# Patient Record
Sex: Male | Born: 1960 | Race: White | Marital: Married | Smoking: Former smoker
Health system: Southern US, Community
[De-identification: ages and names within clinical notes are randomized; demographics above are authoritative.]

## PROBLEM LIST (undated history)

## (undated) DIAGNOSIS — T7840XA Allergy, unspecified, initial encounter: Secondary | ICD-10-CM

## (undated) DIAGNOSIS — M1990 Unspecified osteoarthritis, unspecified site: Secondary | ICD-10-CM

## (undated) DIAGNOSIS — E785 Hyperlipidemia, unspecified: Secondary | ICD-10-CM

## (undated) HISTORY — DX: Unspecified osteoarthritis, unspecified site: M19.90

## (undated) HISTORY — DX: Allergy, unspecified, initial encounter: T78.40XA

## (undated) HISTORY — DX: Hyperlipidemia, unspecified: E78.5

## (undated) HISTORY — PX: WISDOM TOOTH EXTRACTION: SHX21

## (undated) HISTORY — PX: MOUTH SURGERY: SHX715

---

## 2012-05-20 ENCOUNTER — Encounter: Payer: Self-pay | Admitting: Internal Medicine

## 2012-07-15 ENCOUNTER — Ambulatory Visit (AMBULATORY_SURGERY_CENTER): Payer: 59 | Admitting: *Deleted

## 2012-07-15 ENCOUNTER — Encounter: Payer: Self-pay | Admitting: Internal Medicine

## 2012-07-15 VITALS — Ht 75.0 in | Wt 264.2 lb

## 2012-07-15 DIAGNOSIS — Z1211 Encounter for screening for malignant neoplasm of colon: Secondary | ICD-10-CM

## 2012-07-15 MED ORDER — MOVIPREP 100 G PO SOLR: ORAL | Status: DC

## 2012-07-29 ENCOUNTER — Ambulatory Visit (AMBULATORY_SURGERY_CENTER): Payer: 59 | Admitting: Internal Medicine

## 2012-07-29 ENCOUNTER — Encounter: Payer: Self-pay | Admitting: Internal Medicine

## 2012-07-29 VITALS — BP 112/75 | HR 66 | Temp 96.7°F | Resp 20 | Ht 75.0 in | Wt 264.0 lb

## 2012-07-29 DIAGNOSIS — D126 Benign neoplasm of colon, unspecified: Secondary | ICD-10-CM

## 2012-07-29 DIAGNOSIS — Z1211 Encounter for screening for malignant neoplasm of colon: Secondary | ICD-10-CM

## 2012-07-29 MED ORDER — SODIUM CHLORIDE 0.9 % IV SOLN: 500.0000 mL | INTRAVENOUS | Status: DC

## 2012-07-29 NOTE — Op Note (Signed)
[facility] [address] [place], [address]   COLONOSCOPY PROCEDURE REPORT  PATIENT: [name], [name]  MR#: [id number] BIRTHDATE: May 29, 1960 , 52  yrs. old GENDER: Male ENDOSCOPIST: Roxy Cedar, MD REFERRED QM:VHQI Perini, M.D. PROCEDURE DATE:  07/29/2012 PROCEDURE:   Colonoscopy with snare polypectomy x 2 First Screening Colonoscopy - Avg.  risk and is 50 yrs.  old or older Yes.  Prior Negative Screening - Now for repeat screening. N/A  History of Adenoma - Now for follow-up colonoscopy & has been > or = to 3 yrs.  N/A  Polyps Removed Today? Yes. ASA CLASS:   Class II INDICATIONS:average risk screening. MEDICATIONS: MAC sedation, administered by CRNA and propofol (Diprivan) 400mg  IV  DESCRIPTION OF PROCEDURE:   After the risks benefits and alternatives of the procedure were thoroughly explained, informed consent was obtained.  A digital rectal exam revealed no abnormalities of the rectum.   The [id number]  endoscope was introduced through the anus and advanced to the cecum, which was identified by both the appendix and ileocecal valve. No adverse events experienced.   The quality of the prep was excellent, using MoviPrep  The instrument was then slowly withdrawn as the colon was fully examined.    COLON FINDINGS: Two diminutive pedunculated polyps were found in the transverse colon and descending colon.  A polypectomy was performed with a cold snare.  The resection was complete and the polyp tissue was completely retrieved.   Mild diverticulosis was noted in the sigmoid colon.   The colon mucosa was otherwise normal. Retroflexed views revealed internal hemorrhoids. The time to cecum=2 minutes 03 seconds.  Withdrawal time=12 minutes 15 seconds. The scope was withdrawn and the procedure completed.  COMPLICATIONS: There were no complications.  ENDOSCOPIC IMPRESSION: 1.   Two diminutive pedunculated polyps were found in the colon and removed  with a cold snare 2.   Mild diverticulosis was noted in the sigmoid colon 3.   The colon mucosa was otherwise normal  RECOMMENDATIONS: 1. Repeat colonoscopy in 5 years if polyp adenomatous; otherwise 10 years   eSigned:  Roxy Cedar, MD 07/29/2012 9:56 AM   cc: Rodrigo Ran, MD and The Patient   PATIENT NAME:  Michael Daniel, Michael Daniel MR#: [id number]

## 2012-07-29 NOTE — Patient Instructions (Addendum)

## 2012-07-29 NOTE — Progress Notes (Signed)
Called to room to assist during endoscopic procedure.  Patient ID and intended procedure confirmed with present staff. Received instructions for my participation in the procedure from the performing physician.  

## 2012-07-29 NOTE — Progress Notes (Signed)
Patient did not experience any of the following events: a burn prior to discharge; a fall within the facility; wrong site/side/patient/procedure/implant event; or a hospital transfer or hospital admission upon discharge from the facility. (G8907) Patient did not have preoperative order for IV antibiotic SSI prophylaxis. ([id number])  

## 2012-07-30 ENCOUNTER — Telehealth: Payer: Self-pay | Admitting: *Deleted

## 2012-07-30 NOTE — Telephone Encounter (Signed)
  Follow up Call-  Call back number 07/29/2012  Post procedure Call Back phone  # [phone]  Permission to leave phone message Yes     Patient questions:  Do you have a fever, pain , or abdominal swelling? no Pain Score  0 *  Have you tolerated food without any problems? yes  Have you been able to return to your normal activities? yes  Do you have any questions about your discharge instructions: Diet   no Medications  no Follow up visit  no  Do you have questions or concerns about your Care? no  Actions: * If pain score is 4 or above: No action needed, pain <4.

## 2012-08-05 ENCOUNTER — Encounter: Payer: Self-pay | Admitting: Internal Medicine

## 2015-02-19 ENCOUNTER — Ambulatory Visit
Admission: RE | Admit: 2015-02-19 | Discharge: 2015-02-19 | Disposition: A | Discharge status: Home / Self Care | Payer: 59 | Source: Ambulatory Visit | Attending: Internal Medicine | Admitting: Internal Medicine

## 2015-02-19 ENCOUNTER — Other Ambulatory Visit: Payer: Self-pay | Admitting: Internal Medicine

## 2015-02-19 DIAGNOSIS — R140 Abdominal distension (gaseous): Secondary | ICD-10-CM

## 2015-02-19 MED ORDER — IOPAMIDOL (ISOVUE-300) INJECTION 61%
125.0000 mL | Freq: Once | INTRAVENOUS | Status: AC | PRN
  Administered 2015-02-19: 125 mL via INTRAVENOUS

## 2016-07-05 DIAGNOSIS — Z125 Encounter for screening for malignant neoplasm of prostate: Secondary | ICD-10-CM | POA: Diagnosis not present

## 2016-07-05 DIAGNOSIS — E784 Other hyperlipidemia: Secondary | ICD-10-CM | POA: Diagnosis not present

## 2016-07-12 DIAGNOSIS — D126 Benign neoplasm of colon, unspecified: Secondary | ICD-10-CM | POA: Diagnosis not present

## 2016-07-12 DIAGNOSIS — J3089 Other allergic rhinitis: Secondary | ICD-10-CM | POA: Diagnosis not present

## 2016-07-12 DIAGNOSIS — Z0000 Encounter for general adult medical examination without abnormal findings: Secondary | ICD-10-CM | POA: Diagnosis not present

## 2016-07-12 DIAGNOSIS — M545 Low back pain: Secondary | ICD-10-CM | POA: Diagnosis not present

## 2016-07-12 DIAGNOSIS — L570 Actinic keratosis: Secondary | ICD-10-CM | POA: Diagnosis not present

## 2016-07-12 DIAGNOSIS — Z1389 Encounter for screening for other disorder: Secondary | ICD-10-CM | POA: Diagnosis not present

## 2017-04-02 DIAGNOSIS — H40013 Open angle with borderline findings, low risk, bilateral: Secondary | ICD-10-CM | POA: Diagnosis not present

## 2017-04-02 DIAGNOSIS — H40053 Ocular hypertension, bilateral: Secondary | ICD-10-CM | POA: Diagnosis not present

## 2017-06-06 ENCOUNTER — Encounter: Payer: Self-pay | Admitting: Internal Medicine

## 2017-06-19 ENCOUNTER — Encounter: Payer: Self-pay | Admitting: Internal Medicine

## 2017-08-24 ENCOUNTER — Ambulatory Visit (AMBULATORY_SURGERY_CENTER): Payer: Self-pay

## 2017-08-24 VITALS — Ht 75.0 in | Wt 282.0 lb

## 2017-08-24 DIAGNOSIS — Z86010 Personal history of colonic polyps: Secondary | ICD-10-CM

## 2017-08-24 DIAGNOSIS — Z0000 Encounter for general adult medical examination without abnormal findings: Secondary | ICD-10-CM | POA: Diagnosis not present

## 2017-08-24 DIAGNOSIS — Z125 Encounter for screening for malignant neoplasm of prostate: Secondary | ICD-10-CM | POA: Diagnosis not present

## 2017-08-24 DIAGNOSIS — E7849 Other hyperlipidemia: Secondary | ICD-10-CM | POA: Diagnosis not present

## 2017-08-24 MED ORDER — NA SULFATE-K SULFATE-MG SULF 17.5-3.13-1.6 GM/177ML PO SOLN: 1.0000 | Freq: Once | ORAL | 0 refills | Status: AC

## 2017-08-24 NOTE — Progress Notes (Signed)
Denies allergies to eggs or soy products. Denies complication of anesthesia or sedation. Denies use of weight loss medication. Denies use of O2.   Emmi instructions declined.  

## 2017-08-30 ENCOUNTER — Encounter: Payer: Self-pay | Admitting: Internal Medicine

## 2017-08-30 DIAGNOSIS — M545 Low back pain: Secondary | ICD-10-CM | POA: Diagnosis not present

## 2017-08-30 DIAGNOSIS — D126 Benign neoplasm of colon, unspecified: Secondary | ICD-10-CM | POA: Diagnosis not present

## 2017-08-30 DIAGNOSIS — Z0000 Encounter for general adult medical examination without abnormal findings: Secondary | ICD-10-CM | POA: Diagnosis not present

## 2017-08-30 DIAGNOSIS — L570 Actinic keratosis: Secondary | ICD-10-CM | POA: Diagnosis not present

## 2017-08-30 DIAGNOSIS — N528 Other male erectile dysfunction: Secondary | ICD-10-CM | POA: Diagnosis not present

## 2017-08-30 DIAGNOSIS — Z1389 Encounter for screening for other disorder: Secondary | ICD-10-CM | POA: Diagnosis not present

## 2017-09-05 ENCOUNTER — Telehealth: Payer: Self-pay | Admitting: Internal Medicine

## 2017-09-05 NOTE — Telephone Encounter (Signed)
Okay 

## 2017-09-05 NOTE — Telephone Encounter (Signed)
Has been trying to get a straight answer from his insurance about how they will be paying on his upcoming procedure since he received our Precert letter back on [phone]. They have finally today confirmed that he will need to meet his deductible completely before they cover at 80%. Unable to afford this at this time. Will call back later to reschedule.

## 2017-09-07 ENCOUNTER — Encounter: Payer: 59 | Admitting: Internal Medicine

## 2017-09-13 DIAGNOSIS — N433 Hydrocele, unspecified: Secondary | ICD-10-CM | POA: Diagnosis not present

## 2018-07-29 DIAGNOSIS — Z03818 Encounter for observation for suspected exposure to other biological agents ruled out: Secondary | ICD-10-CM | POA: Diagnosis not present

## 2018-10-02 DIAGNOSIS — Z125 Encounter for screening for malignant neoplasm of prostate: Secondary | ICD-10-CM | POA: Diagnosis not present

## 2018-10-02 DIAGNOSIS — E7849 Other hyperlipidemia: Secondary | ICD-10-CM | POA: Diagnosis not present

## 2018-10-02 DIAGNOSIS — Z23 Encounter for immunization: Secondary | ICD-10-CM | POA: Diagnosis not present

## 2018-10-02 DIAGNOSIS — Z0000 Encounter for general adult medical examination without abnormal findings: Secondary | ICD-10-CM | POA: Diagnosis not present

## 2018-10-09 DIAGNOSIS — M545 Low back pain: Secondary | ICD-10-CM | POA: Diagnosis not present

## 2018-10-09 DIAGNOSIS — N529 Male erectile dysfunction, unspecified: Secondary | ICD-10-CM | POA: Diagnosis not present

## 2018-10-09 DIAGNOSIS — Z0000 Encounter for general adult medical examination without abnormal findings: Secondary | ICD-10-CM | POA: Diagnosis not present

## 2018-10-09 DIAGNOSIS — Z1331 Encounter for screening for depression: Secondary | ICD-10-CM | POA: Diagnosis not present

## 2018-10-09 DIAGNOSIS — W108XXS Fall (on) (from) other stairs and steps, sequela: Secondary | ICD-10-CM | POA: Diagnosis not present

## 2018-10-09 DIAGNOSIS — N433 Hydrocele, unspecified: Secondary | ICD-10-CM | POA: Diagnosis not present

## 2018-10-15 ENCOUNTER — Other Ambulatory Visit: Payer: Self-pay | Admitting: Internal Medicine

## 2018-10-15 DIAGNOSIS — E785 Hyperlipidemia, unspecified: Secondary | ICD-10-CM

## 2018-10-16 DIAGNOSIS — Z1211 Encounter for screening for malignant neoplasm of colon: Secondary | ICD-10-CM | POA: Diagnosis not present

## 2018-10-29 ENCOUNTER — Other Ambulatory Visit: Payer: BLUE CROSS/BLUE SHIELD

## 2018-11-13 ENCOUNTER — Ambulatory Visit
Admission: RE | Admit: 2018-11-13 | Discharge: 2018-11-13 | Disposition: A | Discharge status: Home / Self Care | Payer: BC Managed Care – PPO | Source: Ambulatory Visit | Attending: Internal Medicine | Admitting: Internal Medicine

## 2018-11-13 DIAGNOSIS — E785 Hyperlipidemia, unspecified: Secondary | ICD-10-CM

## 2018-11-21 ENCOUNTER — Ambulatory Visit (INDEPENDENT_AMBULATORY_CARE_PROVIDER_SITE_OTHER): Payer: BC Managed Care – PPO | Admitting: Cardiology

## 2018-11-21 ENCOUNTER — Other Ambulatory Visit: Payer: Self-pay

## 2018-11-21 ENCOUNTER — Encounter: Payer: Self-pay | Admitting: Cardiology

## 2018-11-21 VITALS — BP 120/77 | HR 72 | Ht 75.0 in | Wt 279.0 lb

## 2018-11-21 DIAGNOSIS — R0609 Other forms of dyspnea: Secondary | ICD-10-CM

## 2018-11-21 DIAGNOSIS — R0600 Dyspnea, unspecified: Secondary | ICD-10-CM | POA: Diagnosis not present

## 2018-11-21 DIAGNOSIS — Z720 Tobacco use: Secondary | ICD-10-CM | POA: Insufficient documentation

## 2018-11-21 DIAGNOSIS — R931 Abnormal findings on diagnostic imaging of heart and coronary circulation: Secondary | ICD-10-CM | POA: Diagnosis not present

## 2018-11-21 MED ORDER — ATORVASTATIN CALCIUM 80 MG PO TABS: 40.0000 mg | ORAL_TABLET | Freq: Every day | ORAL | 3 refills | Status: DC

## 2018-11-21 NOTE — Patient Instructions (Signed)
Physical activity recommendation (The Physical Activity Guidelines for Americans. JAMA 2018;Nov 12) At least 150-300 minutes a week of moderate-intensity, or 75-150 minutes a week of vigorous-intensity aerobic physical activity, or an equivalent combination of moderate- and vigorous-intensity aerobic activity. Adults should perform muscle-strengthening activities on 2 or more days a week. Older adults should do multicomponent physical activity that includes balance training as well as aerobic and muscle-strengthening activities. Benefits of increased physical activity include lower risk of mortality including cardiovascular mortality, lower risk of cardiovascular events and associated risk factors (hypertension and diabetes), and lower risk of many cancers (including bladder, breast, colon, endometrium, esophagus, kidney, lung, and stomach). Additional improvments have been seen in cognition, risk of dementia, anxiety and depression, improved bone health, lower risk of falls, and associated injuries.  Dietary recommendation The 2019 ACC/AHA guidelines promote nutrition as a main fixture of cardiovascular wellness, with a recommendation for a varied diet of fruit, vegetables, fish, legumes, and whole grains (Class I), as well as recommendations to reduce sodium, cholesterol, processed meats, and refined sugars (Class IIa recommendation).10 Sodium intake, a topic of some controversy as of late, is recommended to be kept at 1,500 mg/day or less, far below the average daily intake in the [place] of 3,409 mg/day, and notably below that of previous [place] recommendations for 300mg /day.10,11 For those unable to reach 1,500 mg/day, they recommend at least a reduction of 1000 mg/day.  A Pesco-Mediterranean Diet With Intermittent Fasting: JACC Review Topic of the Week. J Am Coll Cardiol [id number] Pesco-Mediterranean diet, it is supplemented with extra-virgin olive oil (EVOO), which is the principle fat source, along  with moderate amounts of dairy (particularly yogurt and cheese) and eggs, as well as modest amounts of alcohol consumption (ideally red wine with the evening meal), but few red and processed meats.

## 2018-11-21 NOTE — Progress Notes (Signed)
Patient referred by Crist Infante, MD for elevated calcium score  Subjective:   Michael Daniel, male    DOB: Jan 19, 1960, 58 y.o.   MRN: [id number]   Chief Complaint  Patient presents with  . Coronary Artery Disease  . New Patient (Initial Visit)    HPI  58 y.o. Caucasian male with elevated calcium score.  Patient is a Customer service manager by profession.  He is to be more active with exercise up until 2017, when he had an accident.  Since then, his activity is reduced due to back pain.  He tries to work in the yard etc.  He does not have any chest pain, but does notice exertional dyspnea over the last 2-3 years.  He denies any presyncope syncope, orthopnea, PND, leg edema symptoms.  He smokes 3 to 4 cigars/week for last couple of years.  He is not on any antihypertensive therapy.  He has been on Lipitor 40 mg daily for last few years.  While his LDL is only in 70s, he does have elevated calcium score of 390.  He does not have any known family history of coronary artery disease.  That said, he mentions that his father could not have a certain surgery at age 34, because his heart was "too weak".  In terms of diet, he enjoys his steak every now and then.  Past Medical History:  Diagnosis Date  . Allergy   . Arthritis   . Hyperlipidemia      Past Surgical History:  Procedure Laterality Date  . MOUTH SURGERY  age 67   tooth removed from top of mouth  . WISDOM TOOTH EXTRACTION       Social History   Socioeconomic History  . Marital status: Married    Spouse name: Not on file  . Number of children: Not on file  . Years of education: Not on file  . Highest education level: Not on file  Occupational History  . Not on file  Social Needs  . Financial resource strain: Not on file  . Food insecurity    Worry: Not on file    Inability: Not on file  . Transportation needs    Medical: Not on file    Non-medical: Not on file  Tobacco Use  . Smoking status: Light Tobacco Smoker    Types:  Cigars    Last attempt to quit: 01/16/2002    Years since quitting: 16.8  . Smokeless tobacco: Never Used  . Tobacco comment: occasional cigars  Substance and Sexual Activity  . Alcohol use: Yes    Alcohol/week: 10.0 standard drinks    Types: 10 Cans of beer per week  . Drug use: No  . Sexual activity: Not on file  Lifestyle  . Physical activity    Days per week: Not on file    Minutes per session: Not on file  . Stress: Not on file  Relationships  . Social Herbalist on phone: Not on file    Gets together: Not on file    Attends religious service: Not on file    Active member of club or organization: Not on file    Attends meetings of clubs or organizations: Not on file    Relationship status: Not on file  . Intimate partner violence    Fear of current or ex partner: Not on file    Emotionally abused: Not on file    Physically abused: Not on file  Forced sexual activity: Not on file  Other Topics Concern  . Not on file  Social History Narrative  . Not on file     Family History  Problem Relation Age of Onset  . Pancreatic cancer Mother   . Colon cancer Neg Hx   . Esophageal cancer Neg Hx   . Rectal cancer Neg Hx   . Stomach cancer Neg Hx      Current Outpatient Medications on File Prior to Visit  Medication Sig Dispense Refill  . aspirin EC 81 MG tablet Take 81 mg by mouth. Every now and then    . atorvastatin (LIPITOR) 40 MG tablet Take 40 mg by mouth daily.    . Coenzyme Q10 (COQ10) 200 MG CAPS Take by mouth daily.    . diphenhydrAMINE HCl 50 MG/30ML LIQD Take by mouth daily.    . Ginkgo Biloba (GNP GINGKO BILOBA EXTRACT PO) Take by mouth daily.    . Melatonin 3 MG CAPS Take 30 mg by mouth daily.    . tadalafil (CIALIS) 20 MG tablet Take 20 mg by mouth daily as needed for erectile dysfunction.     No current facility-administered medications on file prior to visit.     Cardiovascular studies:  EKG 11/21/2018: Sinus rhythm 79 bpm. Normal EKG.   CT calcium scoring 11/13/2018: 1. Coronary calcium score is 357 and this is at percentile 90 for patients of the same age, gender and ethnicity. 2. Two small pulmonary nodules, largest measuring 3 mm. These small pulmonary nodules are nonspecific. No follow-up needed if patient is low-risk (and has no known or suspected primary neoplasm). Non-contrast chest CT can be considered in 12 months if patient is high-risk. This recommendation follows the consensus statement: Guidelines for Management of Incidental Pulmonary Nodules Detected on CT Images: From the Fleischner Society 2017; Radiology 2017; 284:228-243. 3.  Aortic Atherosclerosis (ICD10-I70.0).  Recent labs: 10/02/2018: Glucose 93. BUN/Cr 11/1.0. eGFR 76. Na/K 138/4.3.  H/H 15/45. MCV 94. Platelets 293.  Chol 129, TG 91, HDL 38, LDL 73.  TSH normal.    Review of Systems  Constitution: Negative for decreased appetite, malaise/fatigue, weight gain and weight loss.  HENT: Negative for congestion.   Eyes: Negative for visual disturbance.  Cardiovascular: Positive for dyspnea on exertion. Negative for chest pain, leg swelling, palpitations and syncope.  Respiratory: Positive for shortness of breath. Negative for cough.   Endocrine: Negative for cold intolerance.  Hematologic/Lymphatic: Does not bruise/bleed easily.  Skin: Negative for itching and rash.  Musculoskeletal: Negative for myalgias.  Gastrointestinal: Negative for abdominal pain, nausea and vomiting.  Genitourinary: Negative for dysuria.  Neurological: Negative for dizziness and weakness.  Psychiatric/Behavioral: The patient is not nervous/anxious.   All other systems reviewed and are negative.        Vitals:   11/21/18 1044  BP: (!) 151/75  Pulse: 72  SpO2: 97%     Body mass index is 34.87 kg/m. Filed Weights   11/21/18 1044  Weight: 126.6 kg     Objective:   Physical Exam  Constitutional: He is oriented to person, place, and time. He appears  well-developed and well-nourished. No distress.  HENT:  Head: Normocephalic and atraumatic.  Eyes: Pupils are equal, round, and reactive to light. Conjunctivae are normal.  Neck: No JVD present.  Cardiovascular: Normal rate, regular rhythm and intact distal pulses.  Pulses:      Dorsalis pedis pulses are 1+ on the right side and 1+ on the left side.  Posterior tibial pulses are 2+ on the right side and 2+ on the left side.  Pulmonary/Chest: Effort normal and breath sounds normal. He has no wheezes. He has no rales.  Abdominal: Soft. Bowel sounds are normal. There is no rebound.  Musculoskeletal:        General: No edema.  Lymphadenopathy:    He has no cervical adenopathy.  Neurological: He is alert and oriented to person, place, and time. No cranial nerve deficit.  Skin: Skin is warm and dry.  Psychiatric: He has a normal mood and affect.  Nursing note and vitals reviewed.         Assessment & Recommendations:   58 y.o. Caucasian male with elevated calcium score.  While his exertional dyspnea is likely related to deconditioning, and obesity, angina equivalent needs to be considered given his elevated calcium score of 390.  Recommend exercise nuclear stress test and echocardiogram.  I explained the importance of aggressive risk factor modification to the patient.  Continue aspirin 81 mg daily.  Increase Lipitor to 80 mg daily.  Will check lipoprotein a now and repeat a lipid panel in 3 months.  Tobacco cessation counseling:  - Currently smoking 3-4 cigars/day   - Patient was informed of the dangers of tobacco abuse including stroke, cancer, and MI, as well as benefits of tobacco cessation. - Patient is willing to quit at this time. - Approximately 5 mins were spent counseling patient cessation techniques.  Patient would like to quit on his own. - I will reassess his progress at the next follow-up visit  I discussed diet and lifestyle modifications with the patient, as  below.  Physical activity recommendation (The Physical Activity Guidelines for Americans. JAMA 2018;Nov 12) At least 150-300 minutes a week of moderate-intensity, or 75-150 minutes a week of vigorous-intensity aerobic physical activity, or an equivalent combination of moderate- and vigorous-intensity aerobic activity. Adults should perform muscle-strengthening activities on 2 or more days a week. Older adults should do multicomponent physical activity that includes balance training as well as aerobic and muscle-strengthening activities. Benefits of increased physical activity include lower risk of mortality including cardiovascular mortality, lower risk of cardiovascular events and associated risk factors (hypertension and diabetes), and lower risk of many cancers (including bladder, breast, colon, endometrium, esophagus, kidney, lung, and stomach). Additional improvments have been seen in cognition, risk of dementia, anxiety and depression, improved bone health, lower risk of falls, and associated injuries.  Dietary recommendation The 2019 ACC/AHA guidelines promote nutrition as a main fixture of cardiovascular wellness, with a recommendation for a varied diet of fruit, vegetables, fish, legumes, and whole grains (Class I), as well as recommendations to reduce sodium, cholesterol, processed meats, and refined sugars (Class IIa recommendation).10 Sodium intake, a topic of some controversy as of late, is recommended to be kept at 1,500 mg/day or less, far below the average daily intake in the [place] of 3,409 mg/day, and notably below that of previous [place] recommendations for <2,348m/day.10,11 For those unable to reach 1,500 mg/day, they recommend at least a reduction of 1000 mg/day.  A Pesco-Mediterranean Diet With Intermittent Fasting: JACC Review Topic of the Week. J Am Coll Cardiol 28032;12:[id number]-Mediterranean diet, it is supplemented with extra-virgin olive oil (EVOO), which is the principle fat  source, along with moderate amounts of dairy (particularly yogurt and cheese) and eggs, as well as modest amounts of alcohol consumption (ideally red wine with the evening meal), but few red and processed meats.  After the visit, I spoke at length  with patient's wife over the phone and explained the findings and my recommendations.  Total time spent with patient was 60 minutes and greater than 50% of that time was spent in counseling and coordination care with the patient regarding complex decision making and discussion as state above.   Further recommendations after above testing.  Thank you for referring the patient to [place]. Please feel free to contact with any questions.  Nigel Mormon, MD [facility]. [place] Pager: [phone] Office: [phone]

## 2018-11-25 DIAGNOSIS — R931 Abnormal findings on diagnostic imaging of heart and coronary circulation: Secondary | ICD-10-CM | POA: Diagnosis not present

## 2018-11-25 DIAGNOSIS — R0600 Dyspnea, unspecified: Secondary | ICD-10-CM | POA: Diagnosis not present

## 2018-11-26 ENCOUNTER — Other Ambulatory Visit: Payer: Self-pay

## 2018-11-26 ENCOUNTER — Ambulatory Visit (INDEPENDENT_AMBULATORY_CARE_PROVIDER_SITE_OTHER): Payer: BC Managed Care – PPO

## 2018-11-26 DIAGNOSIS — R0609 Other forms of dyspnea: Secondary | ICD-10-CM

## 2018-11-26 LAB — LIPID PANEL
Chol/HDL Ratio: 3 ratio (ref 0.0–5.0)
Cholesterol, Total: 126 mg/dL (ref 100–199)
HDL: 42 mg/dL (ref >39)
LDL Chol Calc (NIH): 64 mg/dL (ref 0–99)
Triglycerides: 108 mg/dL (ref 0–149)
VLDL Cholesterol Cal: 20 mg/dL (ref 5–40)

## 2018-11-26 LAB — LIPOPROTEIN A (LPA): Lipoprotein (a): 18.4 nmol/L (ref <75.0)

## 2018-11-26 NOTE — Progress Notes (Signed)
Called pt to inform him about his lipid panel results. Pt undestood

## 2018-11-27 NOTE — Progress Notes (Signed)
Pt aware of results. Also saw them on portal. Reminded of upcoming appt.//ah

## 2018-12-19 ENCOUNTER — Other Ambulatory Visit: Payer: Self-pay | Admitting: Cardiology

## 2018-12-19 DIAGNOSIS — R0609 Other forms of dyspnea: Secondary | ICD-10-CM

## 2018-12-19 DIAGNOSIS — R931 Abnormal findings on diagnostic imaging of heart and coronary circulation: Secondary | ICD-10-CM

## 2018-12-23 ENCOUNTER — Other Ambulatory Visit: Payer: Self-pay

## 2018-12-23 ENCOUNTER — Ambulatory Visit (INDEPENDENT_AMBULATORY_CARE_PROVIDER_SITE_OTHER): Payer: BC Managed Care – PPO

## 2018-12-23 DIAGNOSIS — R0609 Other forms of dyspnea: Secondary | ICD-10-CM | POA: Diagnosis not present

## 2018-12-23 DIAGNOSIS — R931 Abnormal findings on diagnostic imaging of heart and coronary circulation: Secondary | ICD-10-CM | POA: Diagnosis not present

## 2018-12-25 ENCOUNTER — Other Ambulatory Visit: Payer: Self-pay

## 2018-12-25 ENCOUNTER — Telehealth: Payer: BC Managed Care – PPO | Admitting: Cardiology

## 2018-12-25 VITALS — BP 122/70

## 2018-12-25 DIAGNOSIS — R931 Abnormal findings on diagnostic imaging of heart and coronary circulation: Secondary | ICD-10-CM

## 2018-12-25 NOTE — Progress Notes (Signed)
Patient referred by Crist Infante, MD for elevated calcium score  Subjective:   Michael Daniel, male    DOB: 1960-09-28, 58 y.o.   MRN: [id number]   Chief Complaint  Patient presents with  . Elevated calcium score    HPI  58 y.o. Caucasian male with elevated calcium score.  Workup was reassuring details below. He is trying to eat healthy, and has lost 9 lbs since last visit. BP typically runs 130s/80s. He does not have any chest pain, shortness of breath at this time. He is trying to exercise regularly. He has nearly quit smoking cigar.   Initial consult HPI 11/21/2018: Patient is a Customer service manager by profession.  He is to be more active with exercise up until 2017, when he had an accident.  Since then, his activity is reduced due to back pain.  He tries to work in the yard etc.  He does not have any chest pain, but does notice exertional dyspnea over the last 2-3 years.  He denies any presyncope syncope, orthopnea, PND, leg edema symptoms.  He smokes 3 to 4 cigars/week for last couple of years.  He is not on any antihypertensive therapy.  He has been on Lipitor 40 mg daily for last few years.  While his LDL is only in 70s, he does have elevated calcium score of 390.  He does not have any known family history of coronary artery disease.  That said, he mentions that his father could not have a certain surgery at age 28, because his heart was "too weak".  In terms of diet, he enjoys his steak every now and then.  Past Medical History:  Diagnosis Date  . Allergy   . Arthritis   . Hyperlipidemia      Past Surgical History:  Procedure Laterality Date  . MOUTH SURGERY  age 18   tooth removed from top of mouth  . WISDOM TOOTH EXTRACTION       Social History   Socioeconomic History  . Marital status: Married    Spouse name: Not on file  . Number of children: 2  . Years of education: Not on file  . Highest education level: Not on file  Occupational History  . Not on file  Social Needs   . Financial resource strain: Not on file  . Food insecurity    Worry: Not on file    Inability: Not on file  . Transportation needs    Medical: Not on file    Non-medical: Not on file  Tobacco Use  . Smoking status: Current Some Day Smoker    Types: Cigars    Last attempt to quit: 01/16/2002    Years since quitting: 16.9  . Smokeless tobacco: Never Used  . Tobacco comment: occasional cigars  Substance and Sexual Activity  . Alcohol use: Yes    Alcohol/week: 10.0 standard drinks    Types: 10 Cans of beer per week  . Drug use: No  . Sexual activity: Not on file  Lifestyle  . Physical activity    Days per week: Not on file    Minutes per session: Not on file  . Stress: Not on file  Relationships  . Social Herbalist on phone: Not on file    Gets together: Not on file    Attends religious service: Not on file    Active member of club or organization: Not on file    Attends meetings of clubs or organizations:  Not on file    Relationship status: Not on file  . Intimate partner violence    Fear of current or ex partner: Not on file    Emotionally abused: Not on file    Physically abused: Not on file    Forced sexual activity: Not on file  Other Topics Concern  . Not on file  Social History Narrative  . Not on file     Family History  Problem Relation Age of Onset  . Pancreatic cancer Mother   . Pancreatic disease Mother   . COPD Father   . Colon cancer Neg Hx   . Esophageal cancer Neg Hx   . Rectal cancer Neg Hx   . Stomach cancer Neg Hx      Current Outpatient Medications on File Prior to Visit  Medication Sig Dispense Refill  . aspirin EC 81 MG tablet Take 81 mg by mouth. Every now and then    . atorvastatin (LIPITOR) 80 MG tablet Take 0.5 tablets (40 mg total) by mouth daily. 90 tablet 3  . Coenzyme Q10 (COQ10) 200 MG CAPS Take by mouth daily.    . diphenhydrAMINE HCl 50 MG/30ML LIQD Take by mouth daily.    . Ginkgo Biloba (GNP GINGKO BILOBA  EXTRACT PO) Take by mouth daily.    . Melatonin 3 MG CAPS Take 30 mg by mouth daily.    . tadalafil (CIALIS) 20 MG tablet Take 20 mg by mouth daily as needed for erectile dysfunction.     No current facility-administered medications on file prior to visit.     Cardiovascular studies:  Lexiscan Sestamibi Stress Test 12/58/2020: Nondiagnostic ECG stress. There is mild diaphragmatic attenuation in the inferior and inferoapical wall both on rest and stress images.  There is no reversible defect. In addition there is right ventricular hypertrophy. All segments of left ventricle demonstrated normal wall motion and thickening. Stress LV EF is normal 57%.  No previous exam available for comparison. Low risk study.   Echocardiogram 11/26/2018: Left ventricle cavity is normal in size. Mild concentric hypertrophy of the left ventricle. Normal LV systolic function with EF 56%. Normal global wall motion. Normal diastolic filling pattern.  Mild (Grade I) mitral regurgitation. Mild tricuspid regurgitation. Estimated pulmonary artery systolic pressure is 27 mmHg.  EKG 11/21/2018: Sinus rhythm 79 bpm. Normal EKG.  CT calcium scoring 11/13/2018: 1. Coronary calcium score is 357 and this is at percentile 90 for patients of the same age, gender and ethnicity. 2. Two small pulmonary nodules, largest measuring 3 mm. These small pulmonary nodules are nonspecific. No follow-up needed if patient is low-risk (and has no known or suspected primary neoplasm). Non-contrast chest CT can be considered in 12 months if patient is high-risk. This recommendation follows the consensus statement: Guidelines for Management of Incidental Pulmonary Nodules Detected on CT Images: From the Fleischner Society 2017; Radiology 2017; 284:228-243. 3.  Aortic Atherosclerosis (ICD10-I70.0).  Recent labs: 11/25/2018: Chol 126, TG 108, HDL 42, LDL 64.  Lipoprotein (a) 18.4 normal  10/02/2018: Glucose 93. BUN/Cr 11/1.0. eGFR  76. Na/K 138/4.3.  H/H 15/45. MCV 94. Platelets 293.  Chol 129, TG 91, HDL 38, LDL 73.  TSH normal.    Review of Systems  Constitution: Negative for decreased appetite, malaise/fatigue, weight gain and weight loss.  HENT: Negative for congestion.   Eyes: Negative for visual disturbance.  Cardiovascular: Positive for dyspnea on exertion. Negative for chest pain, leg swelling, palpitations and syncope.  Respiratory: Positive for shortness of breath.  Negative for cough.   Endocrine: Negative for cold intolerance.  Hematologic/Lymphatic: Does not bruise/bleed easily.  Skin: Negative for itching and rash.  Musculoskeletal: Negative for myalgias.  Gastrointestinal: Negative for abdominal pain, nausea and vomiting.  Genitourinary: Negative for dysuria.  Neurological: Negative for dizziness and weakness.  Psychiatric/Behavioral: The patient is not nervous/anxious.   All other systems reviewed and are negative.       No vitals available.  Objective:   Physical Exam  Constitutional: He is oriented to person, place, and time. He appears well-developed and well-nourished. No distress.  Pulmonary/Chest: Effort normal.  Neurological: He is alert and oriented to person, place, and time.  Psychiatric: He has a normal mood and affect.  Nursing note and vitals reviewed.         Assessment & Recommendations:   58 y.o. Caucasian male with elevated calcium score.  Elevated calcium score: No ischemia on stress test. Structurally normal heart.  Continue aggressive risk factor modification.  Continue Aspirin 81 mg, lipitor 80 mg. Repeat lipid panel in 02/2019 and 12/2019. Recommend regular home BP monitoring.  He is looking forward to losing 10% of body weight.  l free to contact with any questions.  Nigel Mormon, MD [facility] Pager: [phone] Office: [phone]

## 2019-03-13 NOTE — Telephone Encounter (Signed)
Should patient be taking a whole 80 mg tablet? Please advise.

## 2019-03-13 NOTE — Telephone Encounter (Signed)
80 mg. Please send a refill of 80 mg 90 pills X 3 refills.  Thanks MJP

## 2019-03-17 ENCOUNTER — Other Ambulatory Visit: Payer: Self-pay

## 2019-03-17 MED ORDER — ATORVASTATIN CALCIUM 80 MG PO TABS: 80.0000 mg | ORAL_TABLET | Freq: Every day | ORAL | 3 refills | Status: DC

## 2019-10-23 DIAGNOSIS — E785 Hyperlipidemia, unspecified: Secondary | ICD-10-CM | POA: Diagnosis not present

## 2019-10-23 DIAGNOSIS — Z125 Encounter for screening for malignant neoplasm of prostate: Secondary | ICD-10-CM | POA: Diagnosis not present

## 2019-10-23 DIAGNOSIS — Z0000 Encounter for general adult medical examination without abnormal findings: Secondary | ICD-10-CM | POA: Diagnosis not present

## 2019-10-30 DIAGNOSIS — Z0000 Encounter for general adult medical examination without abnormal findings: Secondary | ICD-10-CM | POA: Diagnosis not present

## 2019-10-30 DIAGNOSIS — Z1331 Encounter for screening for depression: Secondary | ICD-10-CM | POA: Diagnosis not present

## 2019-10-30 DIAGNOSIS — Z23 Encounter for immunization: Secondary | ICD-10-CM | POA: Diagnosis not present

## 2019-10-30 DIAGNOSIS — E785 Hyperlipidemia, unspecified: Secondary | ICD-10-CM | POA: Diagnosis not present

## 2020-01-05 ENCOUNTER — Encounter: Payer: Self-pay | Admitting: Cardiology

## 2020-01-05 ENCOUNTER — Ambulatory Visit: Payer: BC Managed Care – PPO | Admitting: Cardiology

## 2020-01-05 ENCOUNTER — Other Ambulatory Visit: Payer: Self-pay

## 2020-01-05 VITALS — BP 129/71 | HR 75 | Resp 16 | Ht 75.0 in | Wt 283.0 lb

## 2020-01-05 DIAGNOSIS — I2510 Atherosclerotic heart disease of native coronary artery without angina pectoris: Secondary | ICD-10-CM | POA: Diagnosis not present

## 2020-01-05 DIAGNOSIS — R931 Abnormal findings on diagnostic imaging of heart and coronary circulation: Secondary | ICD-10-CM

## 2020-01-05 NOTE — Progress Notes (Signed)
Patient referred by Crist Infante, MD for elevated calcium score  Subjective:   Michael Daniel, male    DOB: March 07, 1960, 59 y.o.   MRN: [id number]   Chief Complaint  Patient presents with  . Elevated coronary artery calcium score  . Follow-up    HPI  59 y.o. Caucasian male with elevated calcium score.  Patient has not had any chest pain, shortness of breath. He admits that he has not been exercising regularly and has not lost any weight. He recently underwent lipid panel testing with Dr. Joylene Draft, after which he was started on Zetia. Results not available to me.    Current Outpatient Medications on File Prior to Visit  Medication Sig Dispense Refill  . aspirin EC 81 MG tablet Take 81 mg by mouth. Every now and then    . atorvastatin (LIPITOR) 80 MG tablet Take 1 tablet (80 mg total) by mouth daily. 90 tablet 3  . Coenzyme Q10 (COQ10) 200 MG CAPS Take by mouth daily.    . diphenhydrAMINE HCl 50 MG/30ML LIQD Take by mouth daily.    . Ginkgo Biloba (GNP GINGKO BILOBA EXTRACT PO) Take by mouth daily.    . Melatonin 3 MG CAPS Take 30 mg by mouth daily.    . tadalafil (CIALIS) 20 MG tablet Take 20 mg by mouth daily as needed for erectile dysfunction.     No current facility-administered medications on file prior to visit.    Cardiovascular studies:  EKG 01/05/2020: Sinus rhythm 65 bpm Normal EKG  Lexiscan Sestamibi Stress Test 12/22/2018: Nondiagnostic ECG stress. There is mild diaphragmatic attenuation in the inferior and inferoapical wall both on rest and stress images.  There is no reversible defect. In addition there is right ventricular hypertrophy. All segments of left ventricle demonstrated normal wall motion and thickening. Stress LV EF is normal 57%.  No previous exam available for comparison. Low risk study.   Echocardiogram 11/26/2018: Left ventricle cavity is normal in size. Mild concentric hypertrophy of the left ventricle. Normal LV systolic function with EF  56%. Normal global wall motion. Normal diastolic filling pattern.  Mild (Grade I) mitral regurgitation. Mild tricuspid regurgitation. Estimated pulmonary artery systolic pressure is 27 mmHg.  EKG 11/21/2018: Sinus rhythm 79 bpm. Normal EKG.  CT calcium scoring 11/13/2018: 1. Coronary calcium score is 357 and this is at percentile 90 for patients of the same age, gender and ethnicity. 2. Two small pulmonary nodules, largest measuring 3 mm. These small pulmonary nodules are nonspecific. No follow-up needed if patient is low-risk (and has no known or suspected primary neoplasm). Non-contrast chest CT can be considered in 12 months if patient is high-risk. This recommendation follows the consensus statement: Guidelines for Management of Incidental Pulmonary Nodules Detected on CT Images: From the Fleischner Society 2017; Radiology 2017; 284:228-243. 3.  Aortic Atherosclerosis (ICD10-I70.0).  Recent labs: 11/25/2018: Chol 126, TG 108, HDL 42, LDL 64.  Lipoprotein (a) 18.4 normal  10/02/2018: Glucose 93. BUN/Cr 11/1.0. eGFR 76. Na/K 138/4.3.  H/H 15/45. MCV 94. Platelets 293.  Chol 129, TG 91, HDL 38, LDL 73.  TSH normal.    Review of Systems  Constitutional: Negative for decreased appetite, malaise/fatigue, weight gain and weight loss.  HENT: Negative for congestion.   Eyes: Negative for visual disturbance.  Cardiovascular: Positive for dyspnea on exertion. Negative for chest pain, leg swelling, palpitations and syncope.  Respiratory: Positive for shortness of breath. Negative for cough.   Endocrine: Negative for cold intolerance.  Hematologic/Lymphatic: Does  not bruise/bleed easily.  Skin: Negative for itching and rash.  Musculoskeletal: Negative for myalgias.  Gastrointestinal: Negative for abdominal pain, nausea and vomiting.  Genitourinary: Negative for dysuria.  Neurological: Negative for dizziness and weakness.  Psychiatric/Behavioral: The patient is not nervous/anxious.    All other systems reviewed and are negative.      Vitals:   01/05/20 1135  BP: 129/71  Pulse: 75  Resp: 16  SpO2: 95%     Objective:   Physical Exam Vitals and nursing note reviewed.  Constitutional:      General: He is not in acute distress.    Appearance: He is well-developed.  Pulmonary:     Effort: Pulmonary effort is normal.  Neurological:     Mental Status: He is alert and oriented to person, place, and time.           Assessment & Recommendations:   59 y.o. Caucasian male with elevated calcium score.  Elevated calcium score: No angina symptoms. No ischemia on stress test (12/2018). Structurally normal heart.  Continue aggressive risk factor modification.  Continue Aspirin 81 mg, lipitor 80 mg, Zetia 10 mg. Repeat lipid panel with Dr. Joylene Draft in few weeks. Will obtain results.. Encouraged daily walking with 10,000 steps, heart healthy diet with calorie control.  F/u in 1 year  Nigel Mormon, MD [facility] Pager: [phone] Office: [phone]

## 2020-01-12 DIAGNOSIS — U071 COVID-19: Secondary | ICD-10-CM | POA: Diagnosis not present

## 2020-01-12 DIAGNOSIS — R509 Fever, unspecified: Secondary | ICD-10-CM | POA: Diagnosis not present

## 2020-01-12 DIAGNOSIS — R519 Headache, unspecified: Secondary | ICD-10-CM | POA: Diagnosis not present

## 2020-03-07 ENCOUNTER — Other Ambulatory Visit: Payer: Self-pay | Admitting: Cardiology

## 2020-06-02 IMAGING — CT CT HEART SCORING
3 series · 14 of 20 positions shown, 16 images · non-contrast
Comparison: None.

CLINICAL DATA: 58-year-old white male with hyperlipidemia.

EXAM:
CT HEART FOR CALCIUM SCORING
TECHNIQUE: CT heart was performed using prospective ECG gating.
A non-contrast exam for calcium scoring was performed.
Note that this exam targets the heart and the chest was not imaged
in its entirety.

[Series 2: calcium scoring 2.00 qr36 bestdiast 70% · axial · 0.42mm/px · z∈[+1947,+2029]mm · 4 of 69 slices shown]
[im 14/69  vessel]
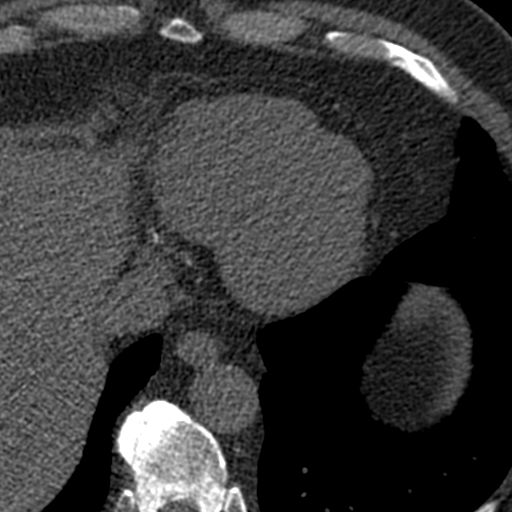
[im 28/69  vessel]
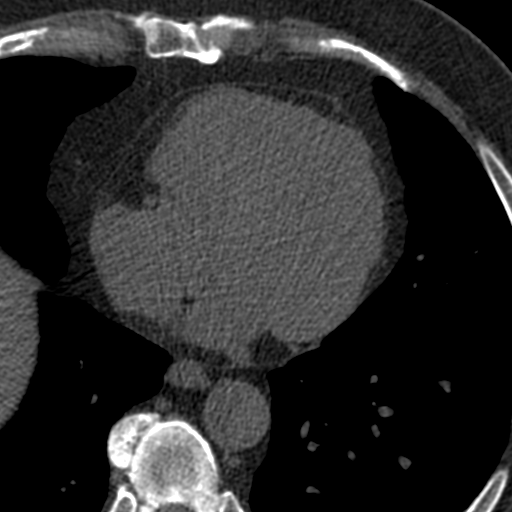
[im 41/69  vessel]
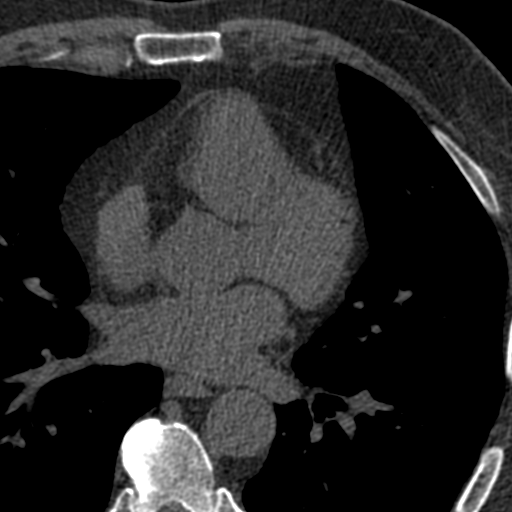
[im 55/69  vessel]
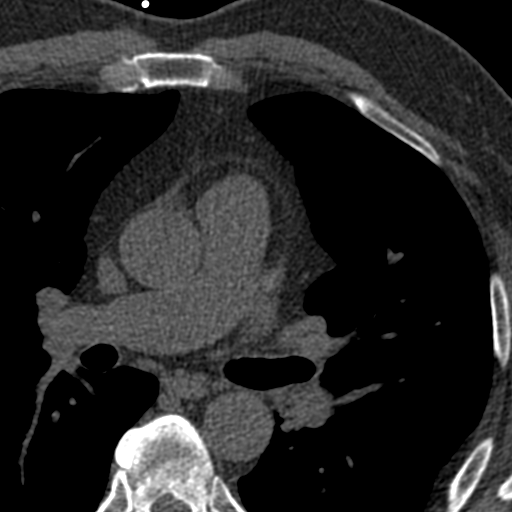

[Series 3: calcium scoring 2.00 br40 bestdiast 70% fov · axial · 0.71mm/px · z∈[+1941,+2033]mm · 5 of 70 slices shown, 7 images]
[im 12/70  vessel]
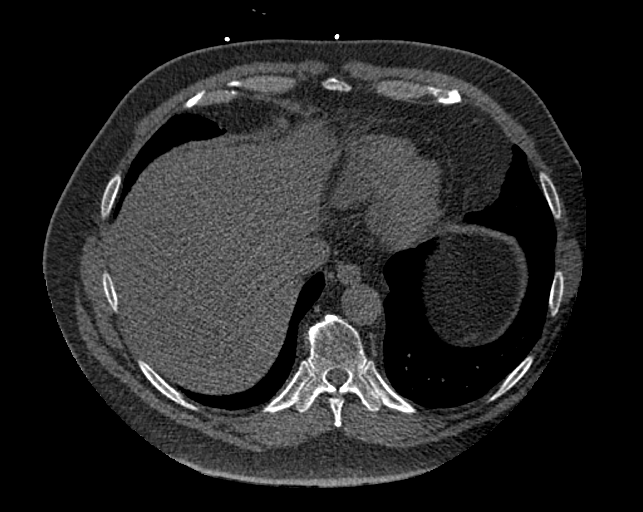
[im 12/70  lung]
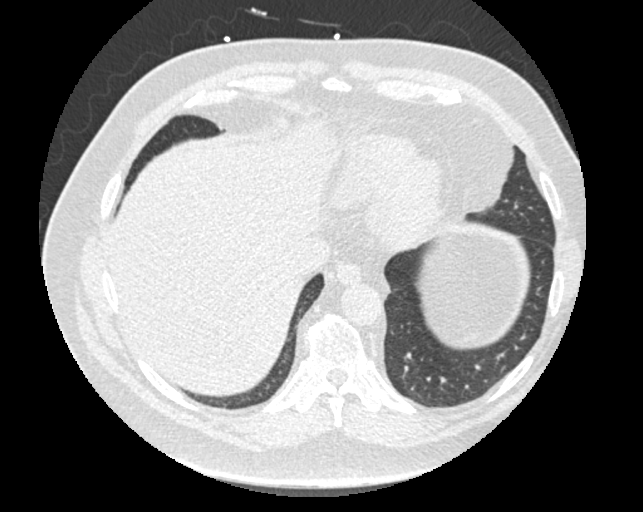
[im 24/70  vessel]
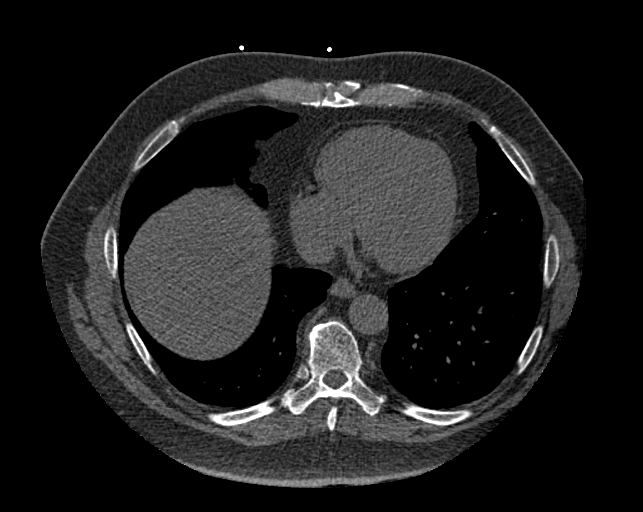
[im 35/70  vessel]
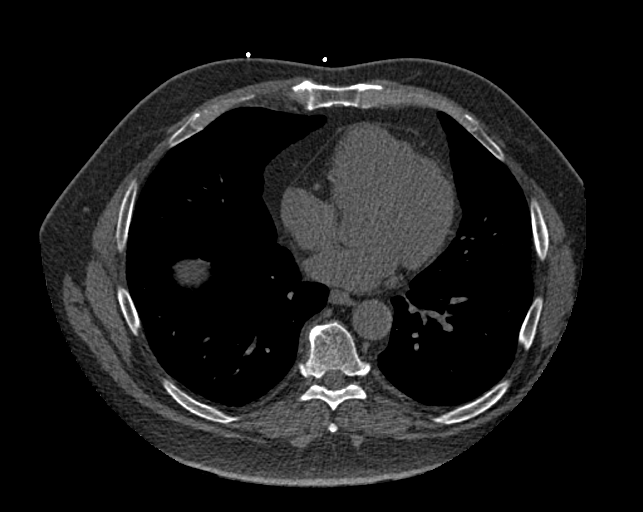
[im 47/70  vessel]
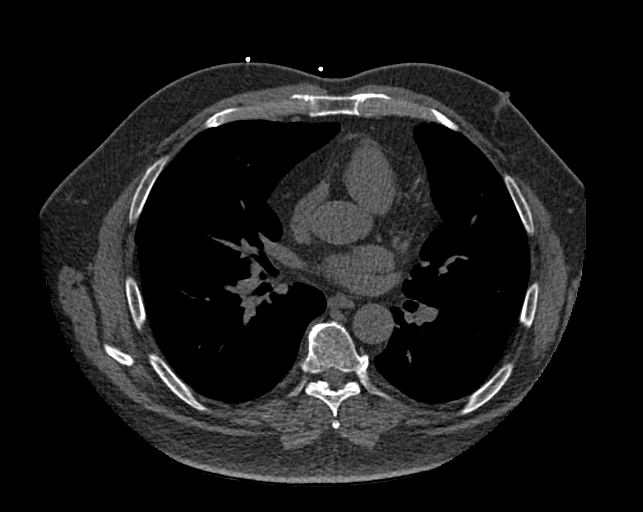
[im 58/70  vessel]
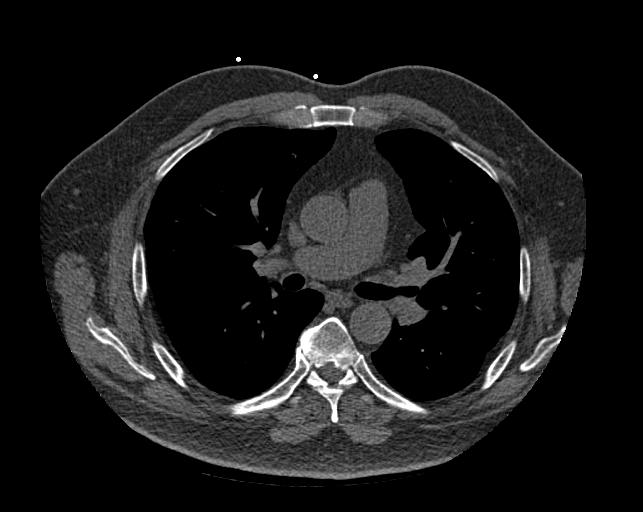
[im 58/70  lung]
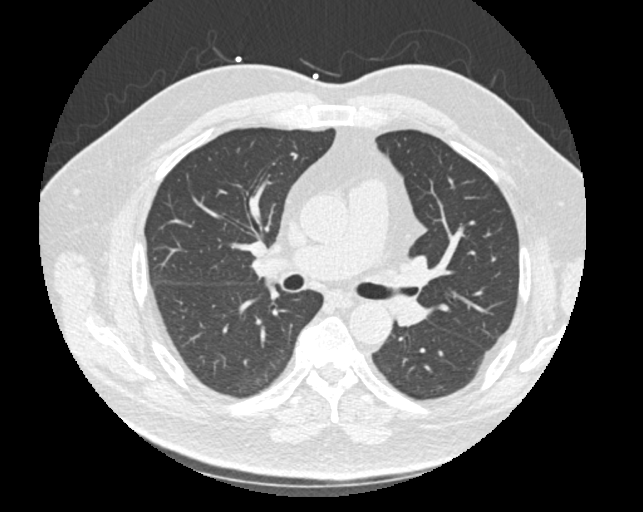

[Series 9: calcium scoring 2.00 br60 bestdiast 70% fov · axial · 0.73mm/px · z∈[+1943,+2033]mm · 5 of 69 slices shown]
[im 12/69  vessel]
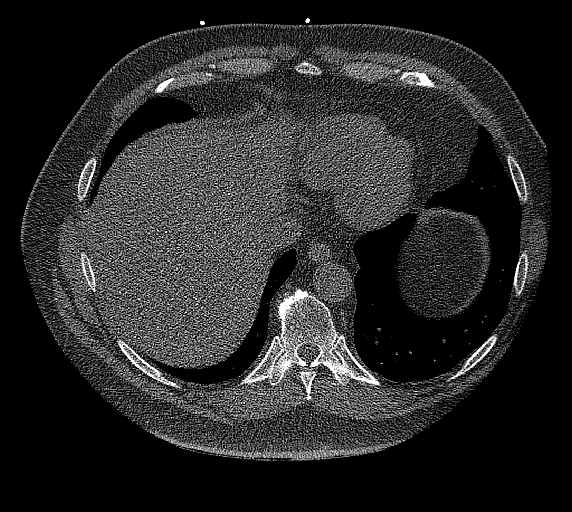
[im 23/69  vessel]
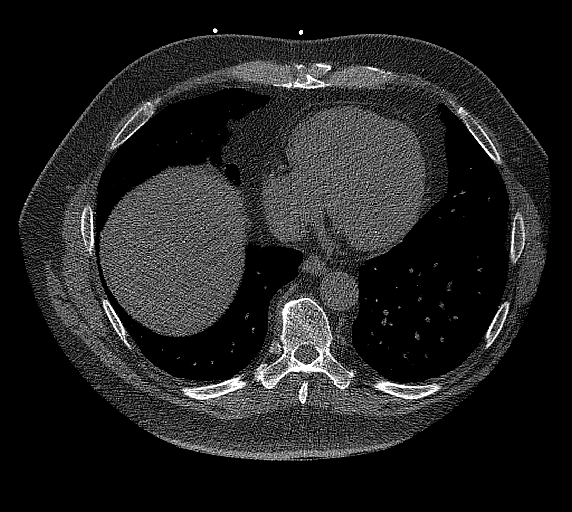
[im 35/69  vessel]
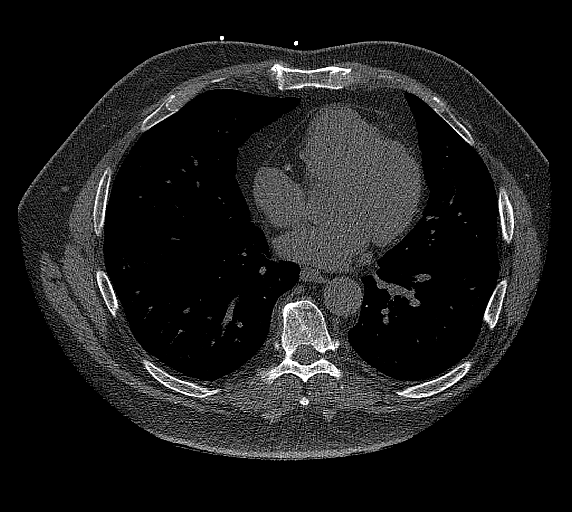
[im 46/69  vessel]
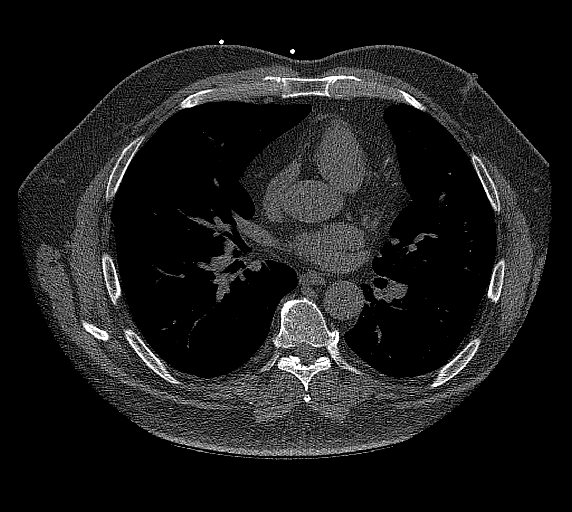
[im 57/69  vessel]
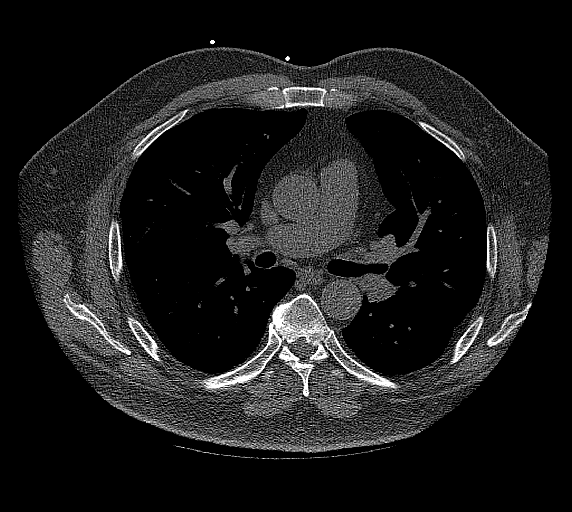

[14 of 20 positions shown; findings below may reference images not displayed]

FINDINGS: Technical quality: Good.

CORONARY CALCIUM

Total Agatston Score: 357

[HOSPITAL] percentile:  90

OTHER FINDINGS:

Cardiovascular: Coronary artery calcifications involving the LAD and
RCA. Small amount of calcium at the aortic root. Ascending thoracic
aorta measures 3.5 cm. Main pulmonary artery measures 3.0 cm. Heart
size is normal without significant pericardial fluid.

Mediastinum/Nodes: Mediastinal structures are unremarkable. No
significant lymph node enlargement. Small lymph node adjacent to the
distal esophagus on sequence 3, image 61 measures 8 mm in the short
axis.

Lungs/Pleura: No large pleural effusions. 3 mm nodule in the right
lower lobe on sequence 9, image 28. 3 mm peripheral nodule in the
left lower lobe on sequence 9, image 37. No large areas of airspace
disease or consolidation in the visualized lungs.

Upper Abdomen: Images of the upper abdomen are within normal limits.

Musculoskeletal: No acute bone abnormality.
IMPRESSION: 1. Coronary calcium score is 357 and this is at percentile 90 for
patients of the same age, gender and ethnicity.
2. Two small pulmonary nodules, largest measuring 3 mm. These small
pulmonary nodules are nonspecific. No follow-up needed if patient is
low-risk (and has no known or suspected primary neoplasm).
Non-contrast chest CT can be considered in 12 months if patient is
high-risk. This recommendation follows the consensus statement:
Guidelines for Management of Incidental Pulmonary Nodules Detected
3.  Aortic Atherosclerosis ([id number]).

## 2020-07-28 DIAGNOSIS — E782 Mixed hyperlipidemia: Secondary | ICD-10-CM | POA: Diagnosis not present

## 2020-07-28 DIAGNOSIS — Z1211 Encounter for screening for malignant neoplasm of colon: Secondary | ICD-10-CM | POA: Diagnosis not present

## 2020-07-28 DIAGNOSIS — Z800 Family history of malignant neoplasm of digestive organs: Secondary | ICD-10-CM | POA: Diagnosis not present

## 2020-08-06 DIAGNOSIS — H40053 Ocular hypertension, bilateral: Secondary | ICD-10-CM | POA: Diagnosis not present

## 2020-08-17 DIAGNOSIS — Z1211 Encounter for screening for malignant neoplasm of colon: Secondary | ICD-10-CM | POA: Diagnosis not present

## 2020-08-17 DIAGNOSIS — D123 Benign neoplasm of transverse colon: Secondary | ICD-10-CM | POA: Diagnosis not present

## 2020-08-17 DIAGNOSIS — K635 Polyp of colon: Secondary | ICD-10-CM | POA: Diagnosis not present

## 2020-08-17 DIAGNOSIS — K5730 Diverticulosis of large intestine without perforation or abscess without bleeding: Secondary | ICD-10-CM | POA: Diagnosis not present

## 2020-11-03 DIAGNOSIS — Z03818 Encounter for observation for suspected exposure to other biological agents ruled out: Secondary | ICD-10-CM | POA: Diagnosis not present

## 2020-11-03 DIAGNOSIS — Z20822 Contact with and (suspected) exposure to covid-19: Secondary | ICD-10-CM | POA: Diagnosis not present

## 2020-11-30 DIAGNOSIS — E785 Hyperlipidemia, unspecified: Secondary | ICD-10-CM | POA: Diagnosis not present

## 2020-11-30 DIAGNOSIS — Z125 Encounter for screening for malignant neoplasm of prostate: Secondary | ICD-10-CM | POA: Diagnosis not present

## 2020-12-07 DIAGNOSIS — D126 Benign neoplasm of colon, unspecified: Secondary | ICD-10-CM | POA: Diagnosis not present

## 2020-12-07 DIAGNOSIS — Z0000 Encounter for general adult medical examination without abnormal findings: Secondary | ICD-10-CM | POA: Diagnosis not present

## 2020-12-07 DIAGNOSIS — Z1331 Encounter for screening for depression: Secondary | ICD-10-CM | POA: Diagnosis not present

## 2020-12-07 DIAGNOSIS — Z1339 Encounter for screening examination for other mental health and behavioral disorders: Secondary | ICD-10-CM | POA: Diagnosis not present

## 2020-12-07 DIAGNOSIS — Z23 Encounter for immunization: Secondary | ICD-10-CM | POA: Diagnosis not present

## 2020-12-31 DIAGNOSIS — I2510 Atherosclerotic heart disease of native coronary artery without angina pectoris: Secondary | ICD-10-CM | POA: Diagnosis not present

## 2020-12-31 DIAGNOSIS — R931 Abnormal findings on diagnostic imaging of heart and coronary circulation: Secondary | ICD-10-CM | POA: Diagnosis not present

## 2021-01-05 ENCOUNTER — Encounter: Payer: Self-pay | Admitting: Cardiology

## 2021-01-05 ENCOUNTER — Ambulatory Visit: Payer: BC Managed Care – PPO | Admitting: Cardiology

## 2021-01-05 ENCOUNTER — Other Ambulatory Visit: Payer: Self-pay

## 2021-01-05 VITALS — BP 120/66 | HR 80 | Temp 98.0°F | Resp 16 | Ht 75.0 in | Wt 292.0 lb

## 2021-01-05 DIAGNOSIS — R931 Abnormal findings on diagnostic imaging of heart and coronary circulation: Secondary | ICD-10-CM

## 2021-01-05 DIAGNOSIS — I2510 Atherosclerotic heart disease of native coronary artery without angina pectoris: Secondary | ICD-10-CM

## 2021-01-05 NOTE — Progress Notes (Signed)
Patient referred by Crist Infante, MD for elevated calcium score  Subjective:   Michael Daniel, male    DOB: Jun 08, 1960, 60 y.o.   MRN: [id number]   Chief Complaint  Patient presents with   Elevated coronary artery calcium score   Follow-up    1 year    HPI  60 y.o. Caucasian male with elevated calcium score.  Patient is doing well. He denies chest pain, shortness of breath, palpitations, leg edema, orthopnea, PND, TIA/syncope. His physical activity has been limited in cold weather, but does bike whenever he can.  Most recently, he biked 30 miles on his road bike without any symptoms.  Reviewed recent lipid panel results with the patient, details below.   Current Outpatient Medications on File Prior to Visit  Medication Sig Dispense Refill   aspirin EC 81 MG tablet Take 81 mg by mouth. Every now and then     atorvastatin (LIPITOR) 80 MG tablet TAKE 1 TABLET BY MOUTH EVERY DAY 90 tablet 3   Cholecalciferol (VITAMIN D) 10 MCG/ML LIQD      Coenzyme Q10 (COQ10) 200 MG CAPS Take by mouth daily.     diphenhydrAMINE HCl 50 MG/30ML LIQD Take by mouth daily.     ezetimibe (ZETIA) 10 MG tablet Take 10 mg by mouth daily.     Garlic 10 MG CAPS      Melatonin 3 MG CAPS Take 30 mg by mouth daily.     No current facility-administered medications on file prior to visit.    Cardiovascular studies:  EKG 01/05/2021: Sinus rhythm 74 bpm Normal EKG  Lexiscan Sestamibi Stress Test 12/22/2018: Nondiagnostic ECG stress. There is mild diaphragmatic attenuation in the inferior and inferoapical wall both on rest and stress images.  There is no reversible defect. In addition there is right ventricular hypertrophy. All segments of left ventricle demonstrated normal wall motion and thickening. Stress LV EF is normal 57%.  No previous exam available for comparison. Low risk study.   Echocardiogram 11/26/2018: Left ventricle cavity is normal in size. Mild concentric hypertrophy of the left  ventricle. Normal LV systolic function with EF 56%. Normal global wall motion. Normal diastolic filling pattern.  Mild (Grade I) mitral regurgitation. Mild tricuspid regurgitation. Estimated pulmonary artery systolic pressure is 27 mmHg.  EKG 11/21/2018: Sinus rhythm 79 bpm. Normal EKG.  CT calcium scoring 11/13/2018: 1. Coronary calcium score is 357 and this is at percentile 90 for patients of the same age, gender and ethnicity. 2. Two small pulmonary nodules, largest measuring 3 mm. These small pulmonary nodules are nonspecific. No follow-up needed if patient is low-risk (and has no known or suspected primary neoplasm). Non-contrast chest CT can be considered in 12 months if patient is high-risk. This recommendation follows the consensus statement: Guidelines for Management of Incidental Pulmonary Nodules Detected on CT Images: From the Fleischner Society 2017; Radiology 2017; 284:228-243. 3.  Aortic Atherosclerosis (ICD10-I70.0).   Recent labs: 11/30/2020: Glucose 92, BUN/Cr 13/0.9. EGFR 86. Na/K 138/4.5. Rest of the CMP normal H/H 15/44. MCV 89. Platelets 324 Chol 114, TG 132, HDL 39, LDL 49   10/23/2019: Glucose NA, BUN/Cr 13/1.0. EGFR 76. HbA1C NA% Chol 127, TG 74, HDL 38, LDL 74 TSH 1.3 normal  11/25/2018: Chol 126, TG 108, HDL 42, LDL 64.  Lipoprotein (a) 18.4 normal  10/02/2018: Glucose 93. BUN/Cr 11/1.0. eGFR 76. Na/K 138/4.3.  H/H 15/45. MCV 94. Platelets 293.  Chol 129, TG 91, HDL 38, LDL 73.  TSH normal.  Review of Systems  Constitutional: Negative for decreased appetite, malaise/fatigue, weight gain and weight loss.  HENT:  Negative for congestion.   Eyes:  Negative for visual disturbance.  Cardiovascular:  Positive for dyspnea on exertion. Negative for chest pain, leg swelling, palpitations and syncope.  Respiratory:  Positive for shortness of breath. Negative for cough.   Endocrine: Negative for cold intolerance.  Hematologic/Lymphatic: Does not  bruise/bleed easily.  Skin:  Negative for itching and rash.  Musculoskeletal:  Negative for myalgias.  Gastrointestinal:  Negative for abdominal pain, nausea and vomiting.  Genitourinary:  Negative for dysuria.  Neurological:  Negative for dizziness and weakness.  Psychiatric/Behavioral:  The patient is not nervous/anxious.   All other systems reviewed and are negative.     Vitals:   01/05/21 1137  BP: 120/66  Pulse: 80  Resp: 16  Temp: 98 F (36.7 C)  SpO2: 95%     Objective:   Physical Exam Vitals and nursing note reviewed.  Constitutional:      General: He is not in acute distress.    Appearance: He is well-developed.  Pulmonary:     Effort: Pulmonary effort is normal.  Neurological:     Mental Status: He is alert and oriented to person, place, and time.          Assessment & Recommendations:   60 y.o. Caucasian male with elevated calcium score.  Elevated calcium score: No angina symptoms. No ischemia on stress test (12/2018). Structurally normal heart.  Continue aggressive risk factor modification.  Continue Aspirin 81 mg.,  Tolerating well without any bleeding. Continue lipitor 80 mg, Zetia 10 mg. Lipids very well controlled, LDL 49.    F/u in 1 year  Nigel Mormon, MD [facility] Pager: [phone] Office: [phone]

## 2021-01-06 ENCOUNTER — Encounter: Payer: Self-pay | Admitting: Cardiology

## 2021-12-15 DIAGNOSIS — Z125 Encounter for screening for malignant neoplasm of prostate: Secondary | ICD-10-CM | POA: Diagnosis not present

## 2021-12-15 DIAGNOSIS — E785 Hyperlipidemia, unspecified: Secondary | ICD-10-CM | POA: Diagnosis not present

## 2021-12-29 ENCOUNTER — Telehealth: Payer: Self-pay | Admitting: Genetic Counselor

## 2021-12-29 NOTE — Telephone Encounter (Signed)
Tried to sched per 12/8 IB, [pt does not want to sched genetics at this time, said he will call back if he want to set up appt

## 2022-01-04 ENCOUNTER — Ambulatory Visit: Payer: Self-pay | Admitting: Cardiology

## 2022-03-06 ENCOUNTER — Other Ambulatory Visit: Payer: BC Managed Care – PPO

## 2022-03-06 ENCOUNTER — Encounter: Payer: BC Managed Care – PPO | Admitting: Genetic Counselor

## 2022-03-13 ENCOUNTER — Ambulatory Visit: Payer: BC Managed Care – PPO | Admitting: Cardiology

## 2022-03-15 ENCOUNTER — Encounter: Payer: Self-pay | Admitting: Cardiology

## 2022-03-15 ENCOUNTER — Ambulatory Visit: Payer: BC Managed Care – PPO | Admitting: Cardiology

## 2022-03-15 VITALS — BP 144/67 | HR 68 | Resp 16 | Ht 75.0 in | Wt 275.8 lb

## 2022-03-15 DIAGNOSIS — R931 Abnormal findings on diagnostic imaging of heart and coronary circulation: Secondary | ICD-10-CM

## 2022-03-15 DIAGNOSIS — I2510 Atherosclerotic heart disease of native coronary artery without angina pectoris: Secondary | ICD-10-CM

## 2022-03-15 NOTE — Progress Notes (Signed)
Patient referred by Crist Infante, MD for elevated calcium score  Subjective:   Michael Daniel, male    DOB: March 15, 1960, 62 y.o.   MRN: [id number]   Chief Complaint  Patient presents with   Elevated coronary artery calcium score   Follow-up    1 year    HPI  62 y.o. Caucasian male with elevated calcium score.  Patient is doing well. He denies chest pain, shortness of breath, palpitations, leg edema, orthopnea, PND, TIA/syncope. His physical activity has been limited in winter. As a result, he has had recent weight gain. Reviewed recent test results with the patient, details below.    Current Outpatient Medications:    aspirin EC 81 MG tablet, Take 81 mg by mouth. Every now and then, Disp: , Rfl:    atorvastatin (LIPITOR) 80 MG tablet, TAKE 1 TABLET BY MOUTH EVERY DAY, Disp: 90 tablet, Rfl: 3   Cholecalciferol (VITAMIN D) 10 MCG/ML LIQD, , Disp: , Rfl:    Coenzyme Q10 (COQ10) 200 MG CAPS, Take by mouth daily., Disp: , Rfl:    ezetimibe (ZETIA) 10 MG tablet, Take 10 mg by mouth daily., Disp: , Rfl:    Melatonin 3 MG CAPS, Take 30 mg by mouth daily., Disp: , Rfl:    tadalafil (CIALIS) 20 MG tablet, Take 20 mg by mouth every 6 (six) hours as needed., Disp: , Rfl:   Cardiovascular studies:  EKG 03/15/2022: Sinus rhythm 67 bpm Normal EKG  Lexiscan Sestamibi Stress Test 12/22/2018: Nondiagnostic ECG stress. There is mild diaphragmatic attenuation in the inferior and inferoapical wall both on rest and stress images.  There is no reversible defect. In addition there is right ventricular hypertrophy. All segments of left ventricle demonstrated normal wall motion and thickening. Stress LV EF is normal 57%.  No previous exam available for comparison. Low risk study.   Echocardiogram 11/26/2018: Left ventricle cavity is normal in size. Mild concentric hypertrophy of the left ventricle. Normal LV systolic function with EF 56%. Normal global wall motion. Normal diastolic filling  pattern.  Mild (Grade I) mitral regurgitation. Mild tricuspid regurgitation. Estimated pulmonary artery systolic pressure is 27 mmHg.  CT calcium scoring 11/13/2018: 1. Coronary calcium score is 357 and this is at percentile 90 for patients of the same age, gender and ethnicity. 2. Two small pulmonary nodules, largest measuring 3 mm. These small pulmonary nodules are nonspecific. No follow-up needed if patient is low-risk (and has no known or suspected primary neoplasm). Non-contrast chest CT can be considered in 12 months if patient is high-risk. This recommendation follows the consensus statement: Guidelines for Management of Incidental Pulmonary Nodules Detected on CT Images: From the Fleischner Society 2017; Radiology 2017; 284:228-243. 3.  Aortic Atherosclerosis (ICD10-I70.0).   Recent labs: 12/15/2021: Glucose NA, BUN/Cr 14/1.0. EGFR 98. K 4.5. Hb 15 HbA1C NA Chol 111, TG 85, HDL 43, LDL 51 TSH 1.2 normal  11/30/2020: Glucose 92, BUN/Cr 13/0.9. EGFR 86. Na/K 138/4.5. Rest of the CMP normal H/H 15/44. MCV 89. Platelets 324 Chol 114, TG 132, HDL 39, LDL 49   10/23/2019: Glucose NA, BUN/Cr 13/1.0. EGFR 76. HbA1C NA% Chol 127, TG 74, HDL 38, LDL 74 TSH 1.3 normal  11/25/2018: Chol 126, TG 108, HDL 42, LDL 64.  Lipoprotein (a) 18.4 normal  10/02/2018: Glucose 93. BUN/Cr 11/1.0. eGFR 76. Na/K 138/4.3.  H/H 15/45. MCV 94. Platelets 293.  Chol 129, TG 91, HDL 38, LDL 73.  TSH normal.    Review of Systems  Constitutional:  Negative for decreased appetite, malaise/fatigue, weight gain and weight loss.  HENT:  Negative for congestion.   Eyes:  Negative for visual disturbance.  Cardiovascular:  Negative for chest pain, dyspnea on exertion, leg swelling, palpitations and syncope.  Respiratory:  Negative for cough and shortness of breath.   Endocrine: Negative for cold intolerance.  Hematologic/Lymphatic: Does not bruise/bleed easily.  Skin:  Negative for itching and rash.   Musculoskeletal:  Negative for myalgias.  Gastrointestinal:  Negative for abdominal pain, nausea and vomiting.  Genitourinary:  Negative for dysuria.  Neurological:  Negative for dizziness and weakness.  Psychiatric/Behavioral:  The patient is not nervous/anxious.   All other systems reviewed and are negative.      Vitals:   03/15/22 1051  BP: (!) 144/67  Pulse: 68  Resp: 16  SpO2: 96%     Objective:   Physical Exam Vitals and nursing note reviewed.  Constitutional:      General: He is not in acute distress.    Appearance: He is well-developed.  Neck:     Vascular: No JVD.  Cardiovascular:     Rate and Rhythm: Normal rate and regular rhythm.     Heart sounds: Normal heart sounds. No murmur heard. Pulmonary:     Effort: Pulmonary effort is normal.     Breath sounds: Normal breath sounds. No wheezing or rales.  Musculoskeletal:     Right lower leg: No edema.     Left lower leg: No edema.  Neurological:     Mental Status: He is alert and oriented to person, place, and time.           Assessment & Recommendations:   62 y.o. Caucasian male with elevated calcium score.  Elevated calcium score: No angina symptoms. No ischemia on stress test (12/2018). Structurally normal heart.  Continue aggressive risk factor modification.  Continue Aspirin 81 mg.,  Tolerating well without any bleeding. Okay to take it every other day. Chol 111, TG 85, HDL 43, LDL 51  (11/2021) Continue lipitor 80 mg, Zetia 10 mg. Discussed regular exercise and intermittent fasting to assist with weight loss.  F/u in 1 year   Nigel Mormon, MD Pager: [phone] Office: [phone]

## 2023-03-16 ENCOUNTER — Ambulatory Visit: Payer: Self-pay | Admitting: Cardiology
